# Patient Record
Sex: Male | Born: 1990
Health system: Southern US, Community
[De-identification: ages and names within clinical notes are randomized; demographics above are authoritative.]

---
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
## 2020-05-11 ENCOUNTER — Ambulatory Visit: Admit: 2020-05-11 | Discharge status: Absent without leave | Payer: Self-pay | Source: Home / Self Care

## 2020-05-11 ENCOUNTER — Ambulatory Visit
Admission: EM | Admit: 2020-05-11 | Discharge: 2020-05-11 | Disposition: A | Discharge status: Home / Self Care | Payer: BC Managed Care – PPO | Attending: Family Medicine | Admitting: Family Medicine

## 2020-05-11 ENCOUNTER — Other Ambulatory Visit: Payer: Self-pay

## 2020-05-11 DIAGNOSIS — R051 Acute cough: Secondary | ICD-10-CM | POA: Diagnosis present

## 2020-05-11 DIAGNOSIS — J22 Unspecified acute lower respiratory infection: Secondary | ICD-10-CM

## 2020-05-11 DIAGNOSIS — Z20822 Contact with and (suspected) exposure to covid-19: Secondary | ICD-10-CM | POA: Insufficient documentation

## 2020-05-11 MED ORDER — PREDNISONE 20 MG PO TABS: 40.0000 mg | ORAL_TABLET | Freq: Every day | ORAL | 0 refills | Status: AC

## 2020-05-11 MED ORDER — AMOXICILLIN-POT CLAVULANATE 875-125 MG PO TABS: 1.0000 | ORAL_TABLET | Freq: Two times a day (BID) | ORAL | 0 refills | Status: AC

## 2020-05-11 MED ORDER — BENZONATATE 100 MG PO CAPS: 100.0000 mg | ORAL_CAPSULE | Freq: Three times a day (TID) | ORAL | 0 refills | Status: AC | PRN

## 2020-05-11 NOTE — ED Provider Notes (Signed)
[facility]    CSN: [id number] Arrival date & time: 05/11/20  1141      History   Chief Complaint Chief Complaint  Patient presents with  . Cough    HPI Chris Gardner is a 30 y.o. male.   Chris Gardner presents with complaints of persistent and worsening of cough. Started 1 week ago, originally with some body aches. These have resolved but now with worsening of cough episodes. Productive cough. Worse at night. Over the counter medications have been helpful during the day. Shortness of breath during episodes only. No history of asthma, doesn't smoke. There has been covid-19 at his work place but no specific known exposure. No gi symptoms.    ROS per HPI, negative if not otherwise mentioned.      History reviewed. No pertinent past medical history.  There are no problems to display for this patient.   History reviewed. No pertinent surgical history.     Home Medications    Prior to Admission medications   Medication Sig Start Date End Date Taking? Authorizing Provider  amoxicillin-clavulanate (AUGMENTIN) 875-125 MG tablet Take 1 tablet by mouth every 12 (twelve) hours. 05/11/20  Yes Affie Gasner, Dorene Grebe B, NP  benzonatate (TESSALON) 100 MG capsule Take 1-2 capsules (100-200 mg total) by mouth 3 (three) times daily as needed for cough. 05/11/20  Yes Rea Reser, Dorene Grebe B, NP  predniSONE (DELTASONE) 20 MG tablet Take 2 tablets (40 mg total) by mouth daily with breakfast for 3 days. 05/11/20 05/14/20 Yes Moe Graca, Dorene Grebe B, NP  SILDENAFIL CITRATE PO Take by mouth.    [provider]    Family History History reviewed. No pertinent family history.  Social History Social History   Tobacco Use  . Smoking status: Never Smoker  . Smokeless tobacco: Never Used  Substance Use Topics  . Alcohol use: Yes     Allergies   Other   Review of Systems Review of Systems   Physical Exam Triage Vital Signs ED Triage Vitals  Enc Vitals Group     BP 05/11/20  1231 (!) 152/92     Pulse Rate 05/11/20 1231 71     Resp 05/11/20 1231 17     Temp 05/11/20 1231 98.1 F (36.7 C)     Temp Source 05/11/20 1231 Oral     SpO2 05/11/20 1231 99 %     Weight --      Height --      Head Circumference --      Peak Flow --      Pain Score 05/11/20 1229 0     Pain Loc --      Pain Edu? --      Excl. in GC? --    No data found.  Updated Vital Signs BP (!) 152/92 (BP Location: Left Arm)   Pulse 71   Temp 98.1 F (36.7 C) (Oral)   Resp 17   SpO2 99%    Physical Exam Constitutional:      Appearance: He is well-developed.  Cardiovascular:     Rate and Rhythm: Normal rate.  Pulmonary:     Effort: Pulmonary effort is normal. No respiratory distress.     Breath sounds: No wheezing.  Skin:    General: Skin is warm and dry.  Neurological:     Mental Status: He is alert and oriented to person, place, and time.      UC Treatments / Results  Labs (all labs ordered are listed, but only  abnormal results are displayed) Labs Reviewed  SARS CORONAVIRUS 2 (TAT 6-24 HRS)    EKG   Radiology No results found.  Procedures Procedures (including critical care time)  Medications Ordered in UC Medications - No data to display  Initial Impression / Assessment and Plan / UC Course  I have reviewed the triage vital signs and the nursing notes.  Pertinent labs & imaging results that were available during my care of the patient were reviewed by me and considered in my medical decision making (see chart for details).     Worsening of productive cough which causes shortness of breath . Lungs currently clear and vitals stable. Opted to provide empiric antibiotics at this time. A few days of prednisone as well. Return precautions provided. Patient verbalized understanding and agreeable to plan. Our of isolation window at this time, covid testing per patient request .   Final Clinical Impressions(s) / UC Diagnoses   Final diagnoses:  Lower respiratory  tract infection     Discharge Instructions     Push fluids to ensure adequate hydration and keep secretions thin.  Tessalon as needed for cough.  Continue with over the counter medications as needed for symptoms.  Complete course of antibiotics.  Three days of prednisone.  Monitor your MyChart for your covid-19 results. Current CDC guidelines recommend 5 days of isolation followed by 5 days of wearing your mask.  Please return for any worsening of symptoms- fevers, shortness of breath or difficulty breathing.    ED Prescriptions    Medication Sig Dispense Auth. Provider   amoxicillin-clavulanate (AUGMENTIN) 875-125 MG tablet Take 1 tablet by mouth every 12 (twelve) hours. 14 tablet Linus Mako B, NP   predniSONE (DELTASONE) 20 MG tablet Take 2 tablets (40 mg total) by mouth daily with breakfast for 3 days. 6 tablet Linus Mako B, NP   benzonatate (TESSALON) 100 MG capsule Take 1-2 capsules (100-200 mg total) by mouth 3 (three) times daily as needed for cough. 21 capsule Georgetta Haber, NP     PDMP not reviewed this encounter.   Georgetta Haber, NP 05/11/20 1313

## 2020-05-11 NOTE — ED Triage Notes (Signed)
Pt is here with a cough, sinuses, sob that started 10 days, pt has taken OTC meds to relieve discomfort.

## 2020-05-11 NOTE — Discharge Instructions (Signed)
Push fluids to ensure adequate hydration and keep secretions thin.  Tessalon as needed for cough.  Continue with over the counter medications as needed for symptoms.  Complete course of antibiotics.  Three days of prednisone.  Monitor your MyChart for your covid-19 results. Current CDC guidelines recommend 5 days of isolation followed by 5 days of wearing your mask.  Please return for any worsening of symptoms- fevers, shortness of breath or difficulty breathing.

## 2020-05-12 LAB — SARS CORONAVIRUS 2 (TAT 6-24 HRS): SARS Coronavirus 2: NEGATIVE
